# Patient Record
Sex: Male | Born: 1995 | Race: Black or African American | Hispanic: No | Marital: Single | Smoking: Never smoker
Health system: Southern US, Community
[De-identification: ages and names within clinical notes are randomized; demographics above are authoritative.]

---
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
## 2016-09-21 ENCOUNTER — Emergency Department (HOSPITAL_BASED_OUTPATIENT_CLINIC_OR_DEPARTMENT_OTHER)
Admission: EM | Admit: 2016-09-21 | Discharge: 2016-09-21 | Disposition: A | Discharge status: Home / Self Care | Payer: Self-pay | Attending: Emergency Medicine | Admitting: Emergency Medicine

## 2016-09-21 ENCOUNTER — Encounter (HOSPITAL_BASED_OUTPATIENT_CLINIC_OR_DEPARTMENT_OTHER): Payer: Self-pay | Admitting: *Deleted

## 2016-09-21 DIAGNOSIS — G4700 Insomnia, unspecified: Secondary | ICD-10-CM | POA: Insufficient documentation

## 2016-09-21 NOTE — Discharge Instructions (Signed)
Start with 1/2 tablet of benadryl before bed. You may progress to 1 tablet. If this does not work you may try [employer]is[employer].  Start with 1/2 tablet and progress to 1 tablet if this does not work.

## 2016-09-21 NOTE — ED Provider Notes (Signed)
[facility] Note   CSN: [id number] Arrival date & time: 09/21/16  2208  By signing my name below, I, Rosana Fret, attest that this documentation has been prepared under the direction and in the presence of Arthor Captain, PA-C.  Electronically Signed: Rosana Fret, ED Scribe. 09/21/16. 10:58 PM.  History   Chief Complaint Chief Complaint  Patient presents with  . Insomnia   The history is provided by the patient. No language interpreter was used.   HPI Comments: Sean Briggs is a 21 y.o. male who presents to the Emergency Department complaining of mild insomnia for 3 days. Pt states he has gotten 4 hours of sleep in the past 3 days total. Pt reports associated numbness to his tongue, eye twitching and dry mouth. No hx of similar symptoms. No treatments tried. No recent stressful event. No caffeine or EtOH use. No new medications. Pt denies difficulty swallowing or any other complaints at this time.  History reviewed. No pertinent past medical history.  There are no active problems to display for this patient.  History reviewed. No pertinent surgical history.  Home Medications    Prior to Admission medications   Not on File    Family History History reviewed. No pertinent family history.  Social History Social History  Substance Use Topics  . Smoking status: Never Smoker  . Smokeless tobacco: Not on file  . Alcohol use No     Allergies   Patient has no known allergies.   Review of Systems Review of Systems  HENT: Negative for trouble swallowing.   Psychiatric/Behavioral: Positive for sleep disturbance. The patient has insomnia.      Physical Exam Updated Vital Signs BP 126/77   Pulse 88   Temp 98.2 F (36.8 C) (Oral)   Resp 18   Ht 6\' 3"  (1.905 m)   Wt 120.2 kg (265 lb)   SpO2 97%   BMI 33.12 kg/m   Physical Exam  Constitutional: He is oriented to person, place, and time. He appears well-developed and well-nourished. No  distress.  HENT:  Head: Normocephalic and atraumatic.  Eyes: Conjunctivae and EOM are normal. No scleral icterus.  Neck: Normal range of motion. Neck supple.  Cardiovascular: Normal rate, regular rhythm and normal heart sounds.   Pulmonary/Chest: Effort normal and breath sounds normal. No respiratory distress.  Abdominal: Soft. He exhibits no distension and no mass. There is no tenderness. There is no guarding.  Musculoskeletal: Normal range of motion. He exhibits no edema.  Neurological: He is alert and oriented to person, place, and time.  Speech is clear and goal oriented, follows commands Major Cranial nerves without deficit, no facial droop Normal strength in upper and lower extremities bilaterally including dorsiflexion and plantar flexion, strong and equal grip strength Sensation normal to light and sharp touch Moves extremities without ataxia, coordination intact Normal finger to nose and rapid alternating movements Neg romberg, no pronator drift Normal gait Normal heel-shin and balance   Skin: Skin is warm and dry. He is not diaphoretic.  Psychiatric: He has a normal mood and affect. His behavior is normal.  Nursing note and vitals reviewed.    ED Treatments / Results  DIAGNOSTIC STUDIES: Oxygen Saturation is 97% on RA, normal by my interpretation.   COORDINATION OF CARE: 10:55 PM-Discussed next steps with pt. Pt verbalized understanding and is agreeable with the plan.   Labs (all labs ordered are listed, but only abnormal results are displayed) Labs Reviewed - No data to display  EKG  EKG Interpretation None       Radiology No results found.  Procedures Procedures (including critical care time)  Medications Ordered in ED Medications - No data to display   Initial Impression / Assessment and Plan / ED Course  I have reviewed the triage vital signs and the nursing notes.  Pertinent labs & imaging results that were available during my care of the  patient were reviewed by me and considered in my medical decision making (see chart for details).      Patient presents to the emergency department complaining of symptoms of insomnia consistent with anxiety. The patient is resting comfortably, in no apparent distress and asymptomatic. No evidence of neuro deficits. Stress reducing mechanisms discussed. Benadryl/ unisom for sleep. Patient has been referred to psychiatric services for follow-up.    Final Clinical Impressions(s) / ED Diagnoses   Final diagnoses:  Insomnia, unspecified type    New Prescriptions There are no discharge medications for this patient. I personally performed the services described in this documentation, which was scribed in my presence. The recorded information has been reviewed and is accurate.       Arthor CaptainHarris, Isahi Godwin, PA-C 09/23/16 2056    Maia PlanLong, Joshua G, MD 09/24/16 [phone]

## 2016-09-21 NOTE — ED Triage Notes (Signed)
Pt reports that he has been unable to sleep x several days. Sleeping 4 hours a night.

## 2016-09-21 NOTE — ED Notes (Signed)
Pt verbalizes understanding of d/c instructions and denies any further needs at this time.
